# Patient Record
Sex: Female | Born: 1985 | Hispanic: Yes | Marital: Single | Smoking: Never smoker
Health system: Southern US, Community
[De-identification: ages and names within clinical notes are randomized; demographics above are authoritative.]

---

## 2004-08-03 ENCOUNTER — Inpatient Hospital Stay: Payer: Self-pay | Admitting: Obstetrics and Gynecology

## 2007-06-01 ENCOUNTER — Ambulatory Visit: Payer: Self-pay | Admitting: Family Medicine

## 2011-06-19 ENCOUNTER — Ambulatory Visit: Payer: Self-pay | Admitting: Family Medicine

## 2011-09-12 ENCOUNTER — Ambulatory Visit: Payer: Self-pay | Admitting: Family Medicine

## 2011-10-15 ENCOUNTER — Ambulatory Visit: Payer: Self-pay | Admitting: Family Medicine

## 2012-10-09 IMAGING — US US OB FOLLOW-UP
1 series · 13 of 28 positions shown · non-contrast
Comparison: none

REASON FOR EXAM: Low Lying Placenta
COMMENTS:

[Series 1: us ob follow-up · 0.31mm/px · 13 of 50 slices shown]
[im 2/50]
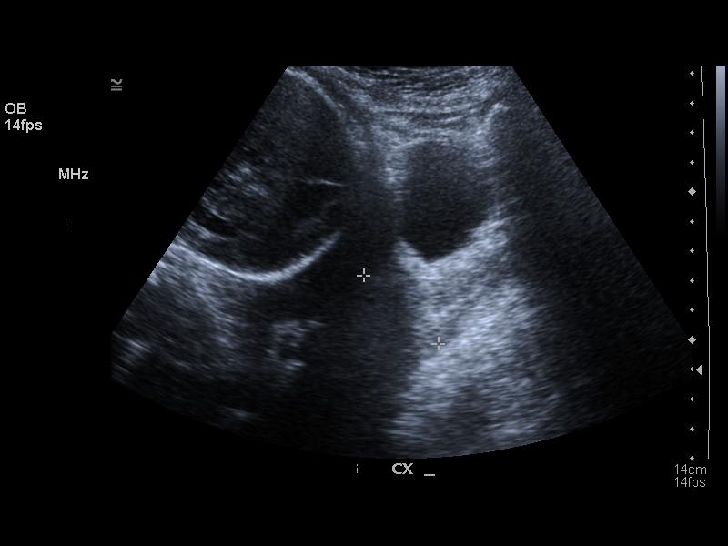
[im 6/50]
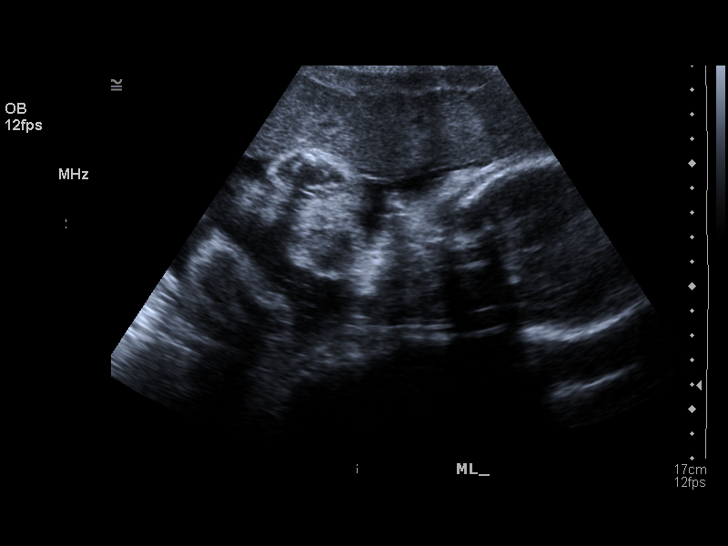
[im 10/50]
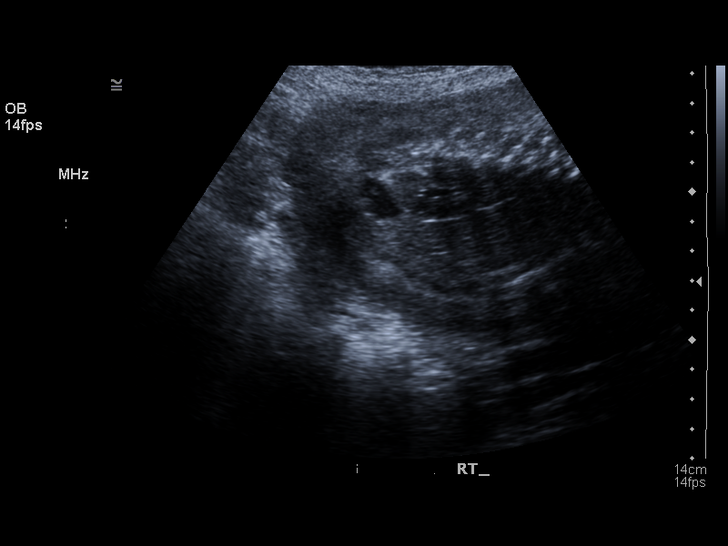
[im 13/50]
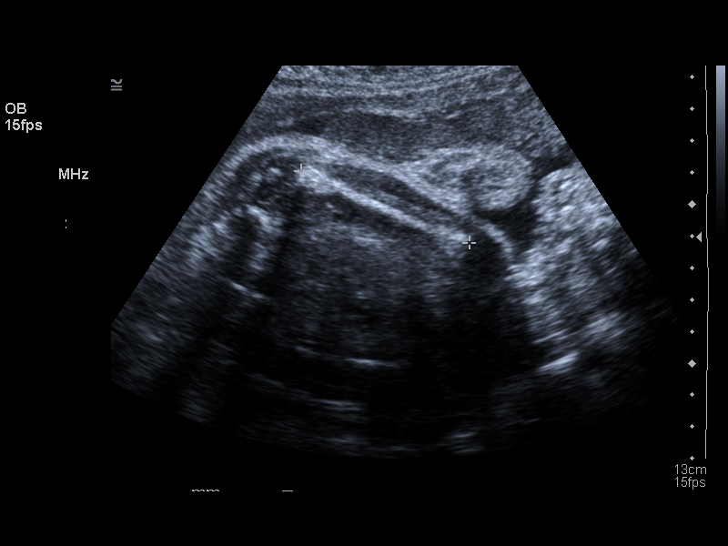
[im 17/50]
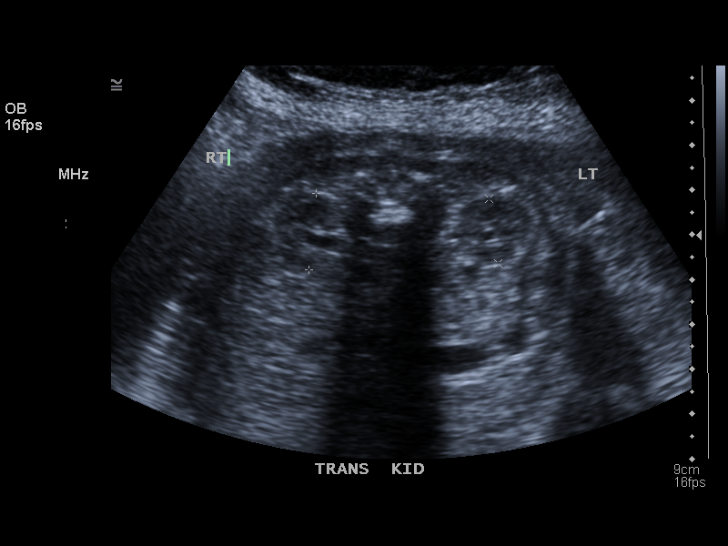
[im 20/50]
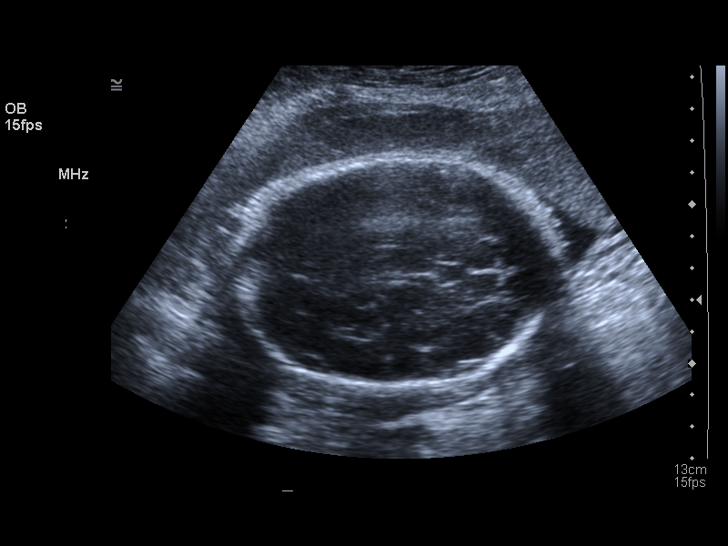
[im 26/50]
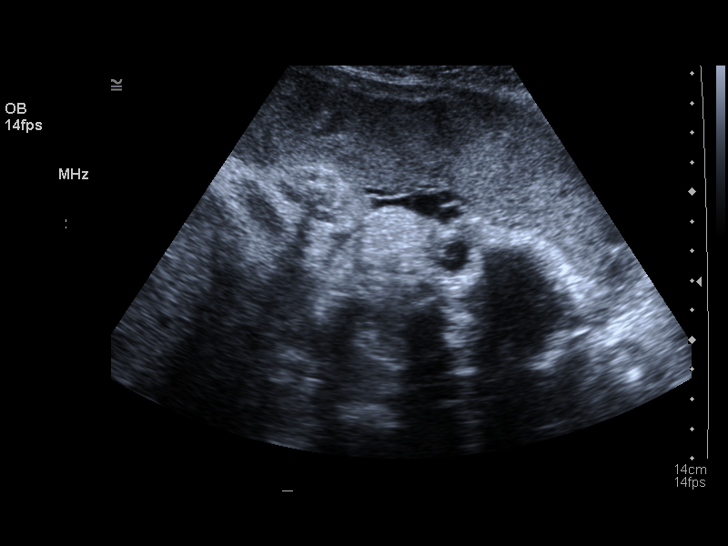
[im 30/50]
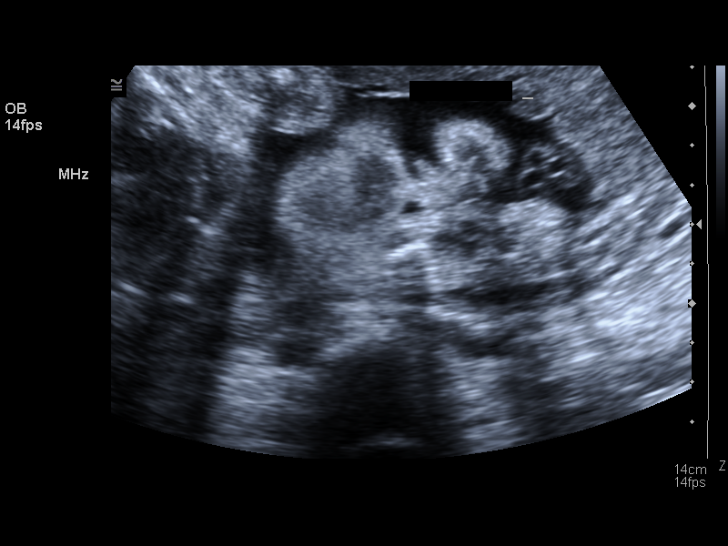
[im 33/50]
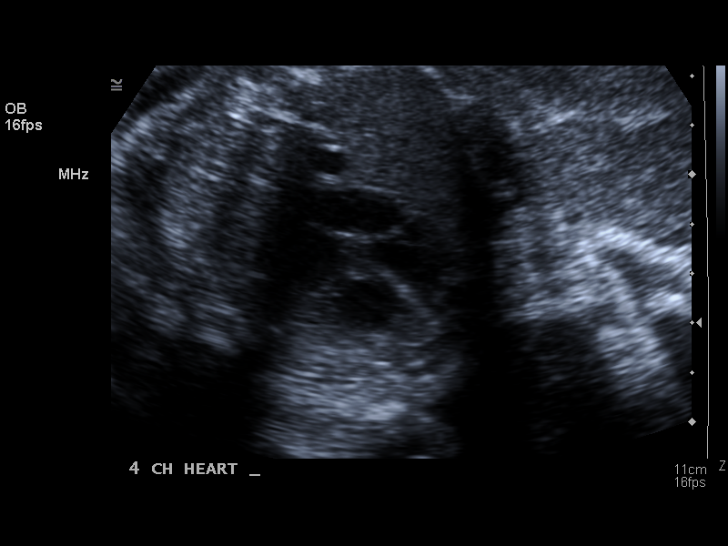
[im 37/50]
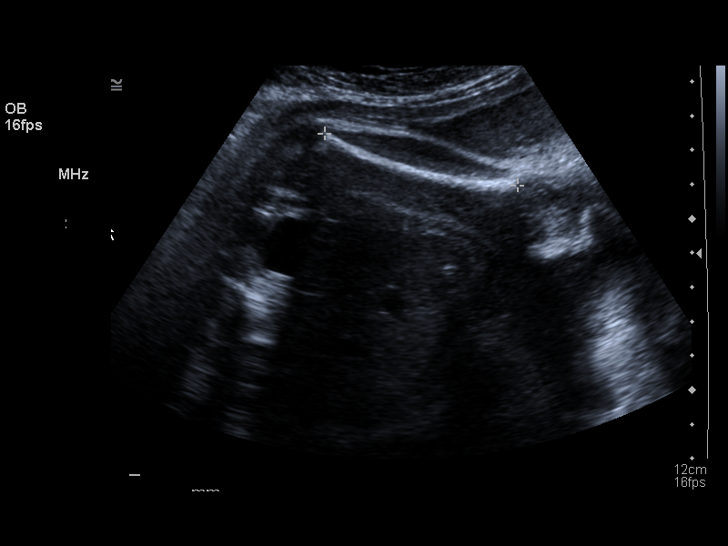
[im 40/50]
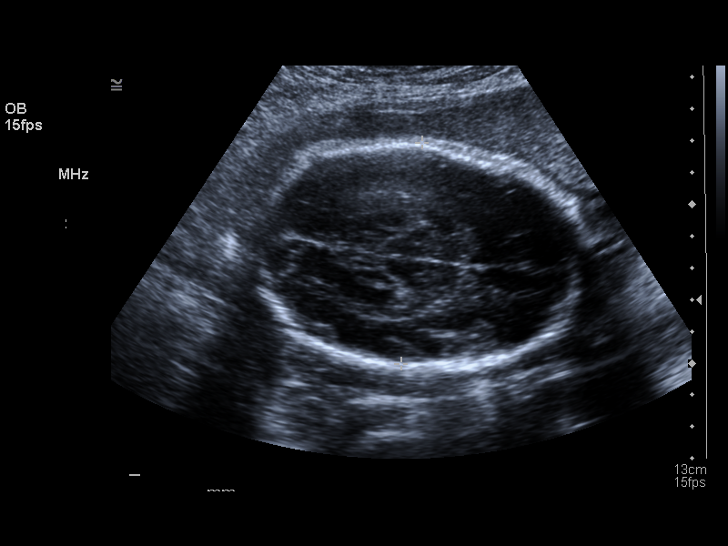
[im 44/50]
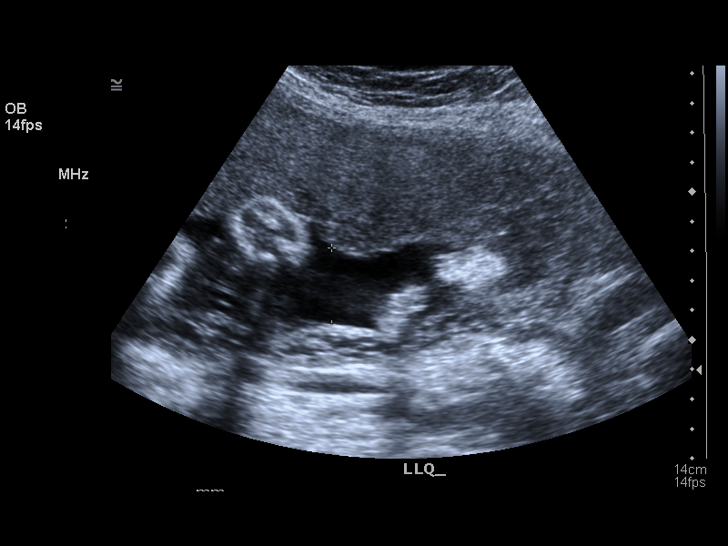
[im 48/50]
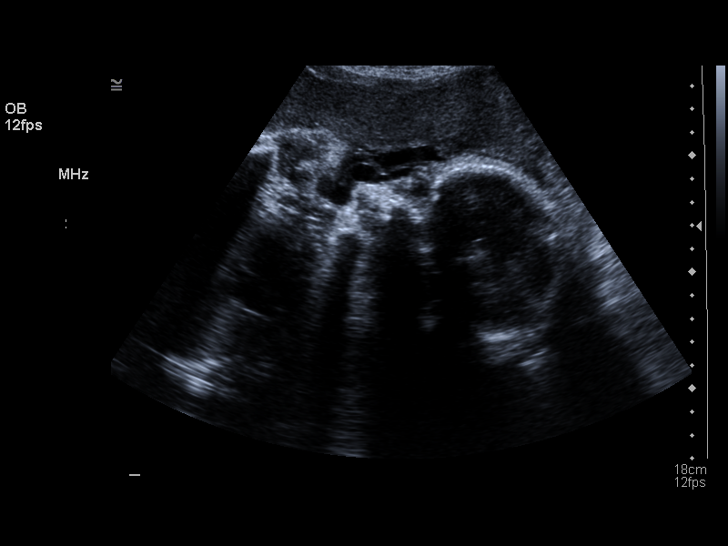

[13 of 28 positions shown; findings below may reference images not displayed]

PROCEDURE:     US  - US OB FOLLOW UP  - September 12, 2011 [DATE]

RESULT:     Followup OB examination shows a living intrauterine gestation.
Presentation currently is cephalic. Fetal heart rate was monitored at 141
beats per minute. The placenta is anterior. The inferior tip of the placenta
is approximately 9.0 cm above the cervix. The placenta no longer is low
lying. Amnionic fluid volume appears scant. AFI measures 9.8 cm which is
near the 5th percentile. The fetal heart, stomach, and urinary bladder are
visualized. No fetal abnormalities are noted. Fetal measurements are as
follows:

BPD     70.4 mm      Corresponding to 28 weeks 2 days.
HC          285.3 mm      Corresponding to 31 weeks 2 days.
AC          274.3 mm      Corresponding to 31 weeks 4 days.
FL          58 mm      Corresponding to 30 weeks 2 days.

AFI is 9.8 cm. EFW is 9041 grams + / - 244 grams. Ultrasound  age based on
today's measurements is approximately 30 weeks 3 days. Ultrasound EDD based
on today's measurements is 11/18/2011. There has been appropriate interval
growth since the prior exam of June 19, 2011.
IMPRESSION: 1. There is a single living intrauterine gestation.
2. The inferior tip of the placenta is approximately 9.0 cm from the cervix.
The placenta is no longer low lying.
3. AFI measures approximately 9.0 cm which is near the 5th percentile.

## 2012-11-11 IMAGING — US US OB FOLLOW-UP
1 series · 17 of 28 positions shown · non-contrast
Comparison: none

REASON FOR EXAM: re eval AFI
COMMENTS:

[Series 1: us ob follow-up · 17 of 48 slices shown]
[im 1/48]
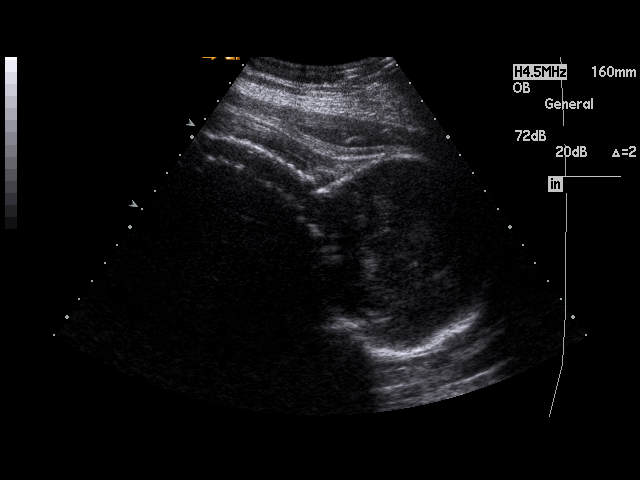
[im 4/48]
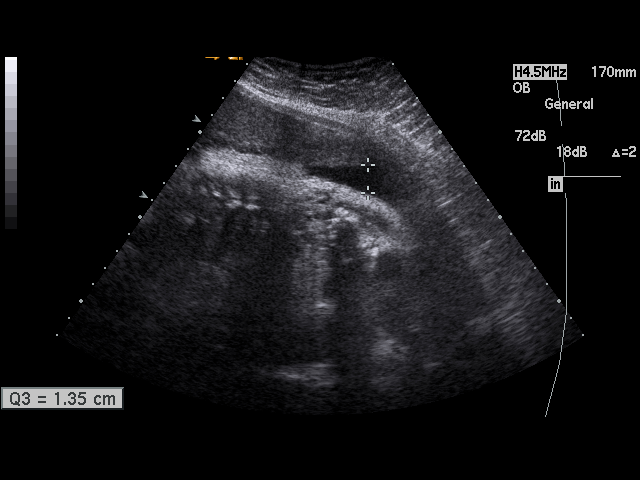
[im 7/48]
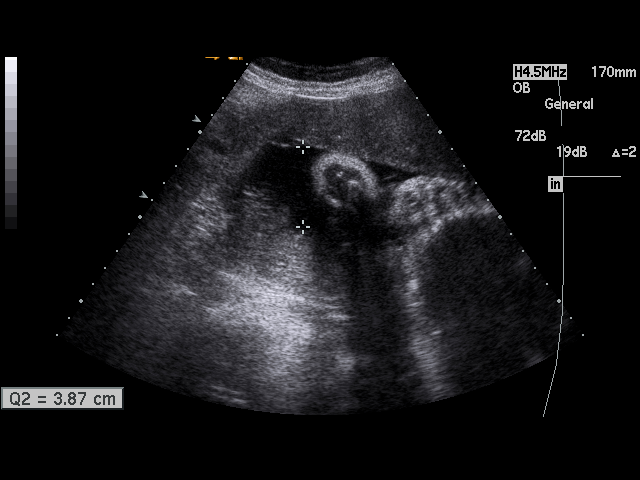
[im 9/48]
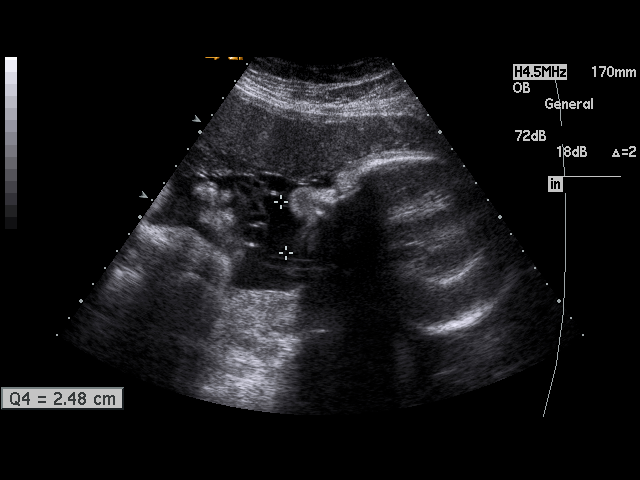
[im 13/48]
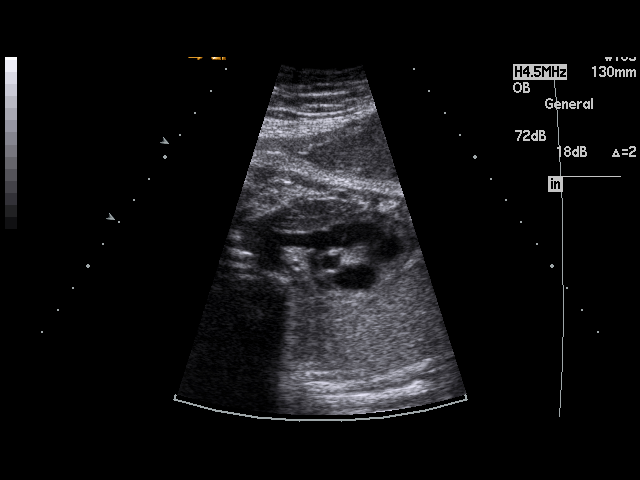
[im 16/48]
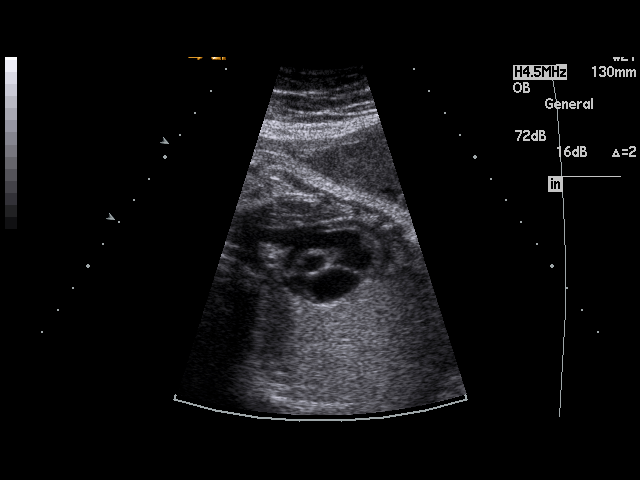
[im 18/48]
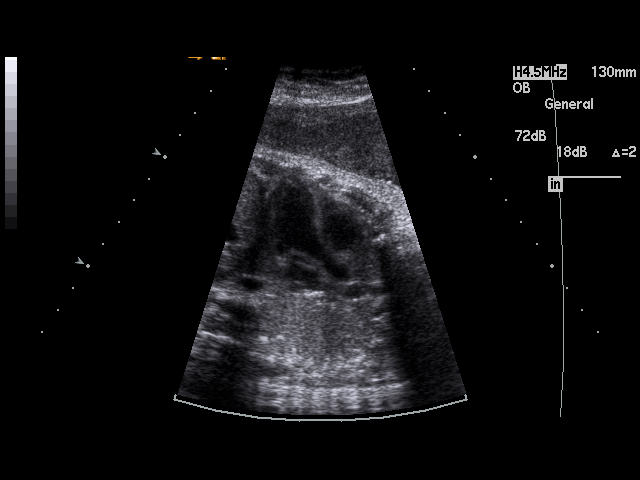
[im 21/48]
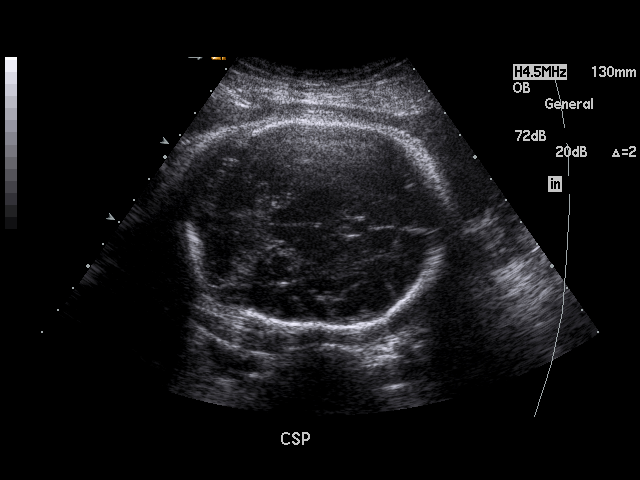
[im 25/48]
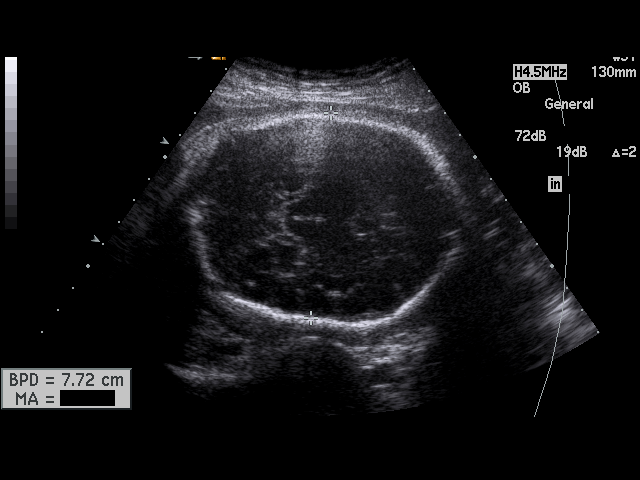
[im 27/48]
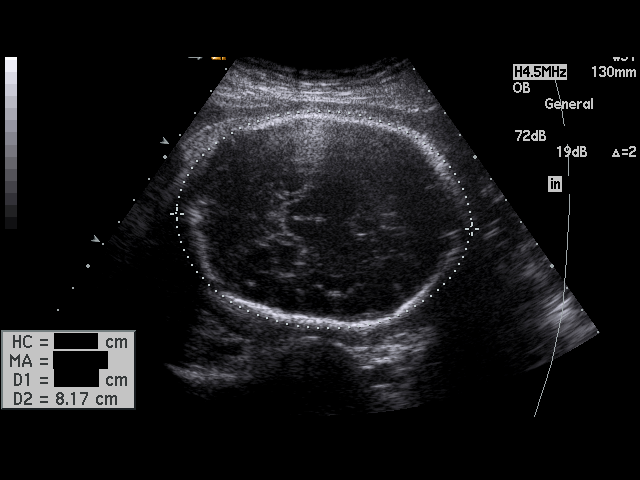
[im 30/48]
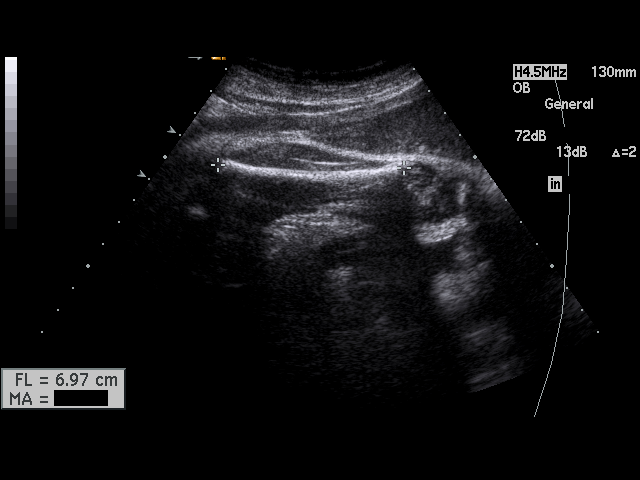
[im 32/48]
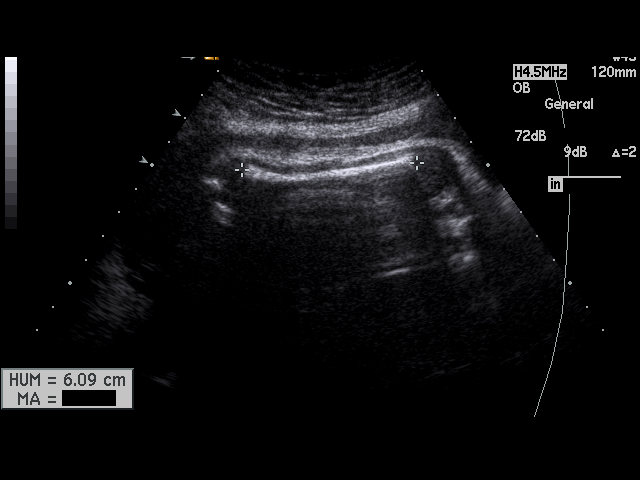
[im 35/48]
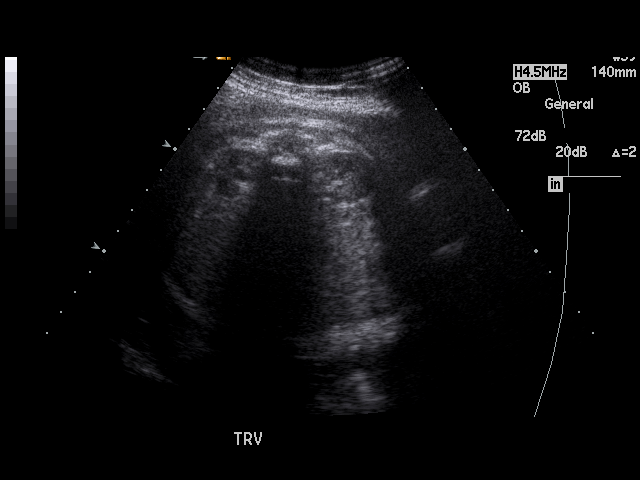
[im 39/48]
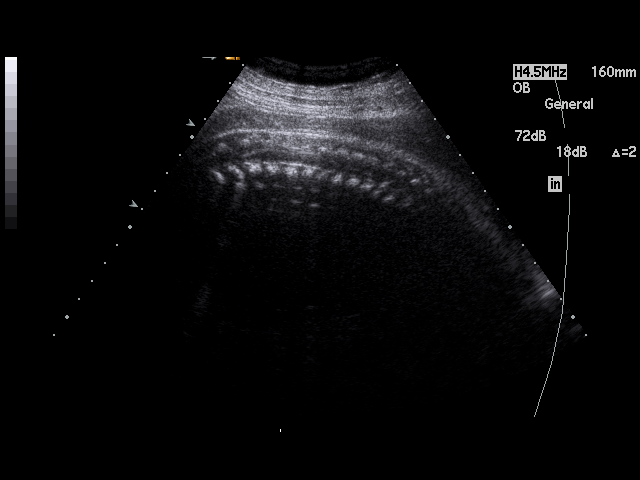
[im 41/48]
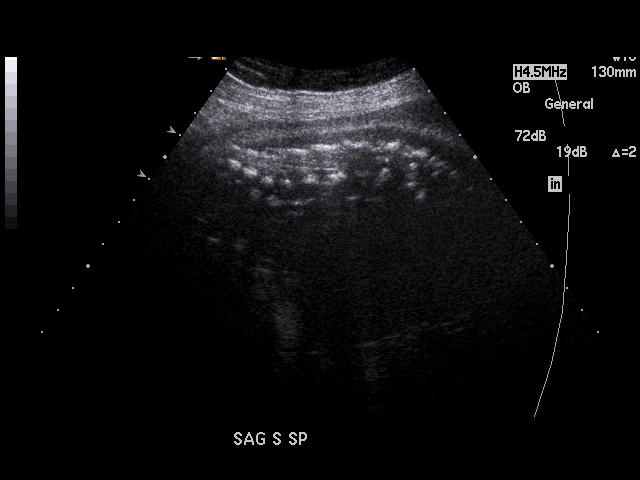
[im 44/48]
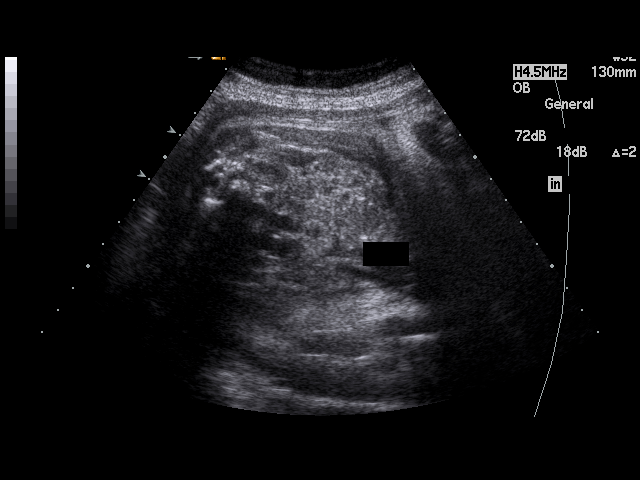
[im 48/48]
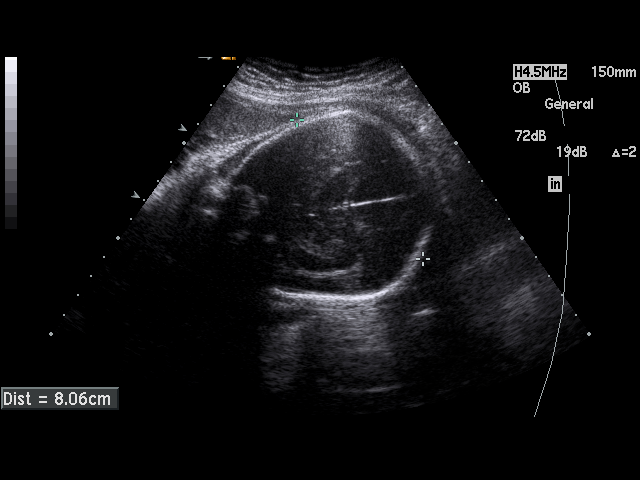

[17 of 28 positions shown; findings below may reference images not displayed]

PROCEDURE:     US  - US OB FOLLOW UP  - October 15, 2011 [DATE]

RESULT:     The patient underwent repeat examination to assess the amniotic
fluid index. The patient had previously undergone ultrasound on 12 September, 2011 at which time an IVP was demonstrated with an amniotic fluid index of 9
cm at approximately the 5th percentile.

On today's study a viable IUP with cephalic presentation and anterior and
partially fundal placenta is seen. There is no evidence of placenta previa
with the lower uterine segment at least 8 cm from the internal cervical os.
The amniotic fluid index measures 8.57 cm which corresponds to
approximately the 5th percentile.

Measured parameters:
BPD 7.7 cm corresponding to an EGA of 31 weeks 1day
HC 30.21 cm corresponding to an EGA of 33 weeks 4 days
AC 31.69 cm corresponding to an EGA of 35 weeks 4 days
FL 6.94 cm corresponding to an EGA of 35 weeks 4 days
Estimated fetal weight 8687 grams + / - 419 grams.

A fetal cardiac rate of 133 beats per minute was demonstrated. A 4 chambered
heart was demonstrated. The kidneys and urinary bladder were demonstrated.
The stomach was not demonstrated.
IMPRESSION: There is a viable IUP with cephalic presentation and
persistent oligohydramnios with the amniotic fluid index measuring
approximately the 5th percentile. The estimated gestational age is 34 weeks
2 days plus or minus approximately 20 days. The estimated date of
confinement is 24 November, 2011.

## 2015-09-25 ENCOUNTER — Encounter: Payer: Self-pay | Admitting: Emergency Medicine

## 2015-09-25 DIAGNOSIS — Z3A01 Less than 8 weeks gestation of pregnancy: Secondary | ICD-10-CM | POA: Insufficient documentation

## 2015-09-25 DIAGNOSIS — O209 Hemorrhage in early pregnancy, unspecified: Secondary | ICD-10-CM | POA: Insufficient documentation

## 2015-09-25 LAB — CBC WITH DIFFERENTIAL/PLATELET
BASOS ABS: 0.1 10*3/uL (ref 0–0.1)
Basophils Relative: 1 %
Eosinophils Absolute: 0.1 10*3/uL (ref 0–0.7)
HCT: 26.8 % — ABNORMAL LOW (ref 35.0–47.0)
HEMOGLOBIN: 7.7 g/dL — AB (ref 12.0–16.0)
LYMPHS ABS: 3 10*3/uL (ref 1.0–3.6)
MCH: 15.1 pg — AB (ref 26.0–34.0)
MCHC: 28.8 g/dL — ABNORMAL LOW (ref 32.0–36.0)
MCV: 52.6 fL — AB (ref 80.0–100.0)
Monocytes Absolute: 0.5 10*3/uL (ref 0.2–0.9)
Monocytes Relative: 6 %
NEUTROS ABS: 4 10*3/uL (ref 1.4–6.5)
PLATELETS: 416 10*3/uL (ref 150–440)
RBC: 5.1 MIL/uL (ref 3.80–5.20)
RDW: 21.8 % — ABNORMAL HIGH (ref 11.5–14.5)
WBC: 7.7 10*3/uL (ref 3.6–11.0)

## 2015-09-25 LAB — URINALYSIS COMPLETE WITH MICROSCOPIC (ARMC ONLY)
Bilirubin Urine: NEGATIVE
GLUCOSE, UA: NEGATIVE mg/dL
Ketones, ur: NEGATIVE mg/dL
NITRITE: NEGATIVE
PROTEIN: NEGATIVE mg/dL
Specific Gravity, Urine: 1.023 (ref 1.005–1.030)
pH: 8 (ref 5.0–8.0)

## 2015-09-25 LAB — POCT PREGNANCY, URINE: Preg Test, Ur: POSITIVE — AB

## 2015-09-25 LAB — ABO/RH: ABO/RH(D): O POS

## 2015-09-25 LAB — HCG, QUANTITATIVE, PREGNANCY: HCG, BETA CHAIN, QUANT, S: 201 m[IU]/mL — AB (ref <5)

## 2015-09-25 NOTE — ED Notes (Signed)
Pt arrived to ED with c/o bloody discharge with  Wiping. Pt reports [redacted] weeks pregnant. [facility]. Pt c/i abdominal cramping and lower back pain. G-5,P3,A-1

## 2015-09-26 ENCOUNTER — Emergency Department
Admission: EM | Admit: 2015-09-26 | Discharge: 2015-09-26 | Discharge status: Against Medical Advice | Payer: Self-pay | Attending: Emergency Medicine | Admitting: Emergency Medicine

## 2015-09-26 ENCOUNTER — Telehealth: Payer: Self-pay | Admitting: Emergency Medicine

## 2015-09-26 NOTE — ED Notes (Signed)
Pt called to ask about lab tests as she left without being seen.  She has called [facility], but they have had a ?leak in the office and are closed today.  I told her the lab results--hcg done and rh positive.  I told her that she needs to call office back to see if they will be open tomorrow and to tell them about the labwork we did.  She is currently not having any bleeding or discharge or pain.  I told her that she has low hgb and hct, but she says she has a history of anemia.  She has no previous values in our system, so I advised her to ask office if her current values are changed from previous.  She agrees and will return here for exam if she gets worse.

## 2021-11-11 ENCOUNTER — Ambulatory Visit: Payer: Self-pay | Admitting: Dermatology

## 2021-12-11 ENCOUNTER — Other Ambulatory Visit: Payer: Self-pay

## 2021-12-11 ENCOUNTER — Ambulatory Visit (INDEPENDENT_AMBULATORY_CARE_PROVIDER_SITE_OTHER): Payer: Self-pay | Admitting: Dermatology

## 2021-12-11 DIAGNOSIS — Q825 Congenital non-neoplastic nevus: Secondary | ICD-10-CM

## 2021-12-11 DIAGNOSIS — B360 Pityriasis versicolor: Secondary | ICD-10-CM

## 2021-12-11 DIAGNOSIS — L853 Xerosis cutis: Secondary | ICD-10-CM

## 2021-12-11 MED ORDER — KETOCONAZOLE 2 % EX CREA: 1.0000 "application " | TOPICAL_CREAM | Freq: Every day | CUTANEOUS | 1 refills | Status: AC

## 2021-12-11 NOTE — Progress Notes (Signed)
? ?  New Patient Visit ? ?Subjective  ?Christy King is a 36 y.o. female who presents for the following: New Patient (Initial Visit) (Patient here today concern a dark spot at chest/abdomen she noticed a year ago. Patient reports that it doesn't bother her but she would like to make sure it was nothing serious. ).  The patient reports no history of injury or rash in the area. ?The patient has spots, moles and lesions to be evaluated, some may be new or changing and the patient has concerns that these could be cancer. ? ?The following portions of the chart were reviewed this encounter and updated as appropriate:  ? Tobacco  Allergies  Meds  Problems  Med Hx  Surg Hx  Fam Hx   ?  ?Review of Systems:  No other skin or systemic complaints except as noted in HPI or Assessment and Plan. ? ?Objective  ?Well appearing patient in no apparent distress; mood and affect are within normal limits. ? ?A focused examination was performed including right scapula back, chest. Relevant physical exam findings are noted in the Assessment and Plan. ? ?xyphoid area ?1.5 cm hyperpigmented macule  ? ? ? ? ? ? ?right scapula ?Light brown macule cafe au lait  ? ? ? ? ? ? ? 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  It is generally asymptomatic. The rash is due to overgrowth of a common type of yeast present on everyone's skin and it is not contagious.  It tends to flare more in the summer due to increased sweating on trunk.  After rash is treated, the scaliness will resolve, but the discoloration will take longer to return to normal pigmentation. The periodic use of an OTC medicated soap/shampoo with zinc or selenium sulfide can be helpful to prevent yeast overgrowth and recurrence. ?  ?Not curable but treatable.  ? ?Information given to patient at visit today. ? ?Start ketoconazole 2  % cream - apply topically to affected areas at chest for 6 weeks.  ? ?ketoconazole (NIZORAL) 2 % cream - xyphoid area ?Apply 1 application. topically daily. To affected dark spots at chest. Use for 6 weeks. ? 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  Observation.  Call clinic for new or changing lesions.  Recommend daily use of broad spectrum spf 30+ sunscreen to sun-exposed areas.  ? ?Xerosis ?- diffuse xerotic patches ?- recommend gentle, hydrating skin care ?- gentle skin care handout given ? ?Return for 3 month follow up on tinea vessicolor . ? ?I, Asher Muir, CMA, am acting as scribe for Armida Sans, MD. ?Documentation: I have reviewed the above documentation for accuracy and completeness, and I agree with the above. ? ?Armida Sans, MD ? ? ? ?

## 2021-12-11 NOTE — Patient Instructions (Addendum)
Gentle Skin Care Guide ° °1. Bathe no more than once a day. ° °2. Avoid bathing in hot water ° °3. Use a mild soap like Dove, Vanicream, Cetaphil, CeraVe. Can use Lever 2000 or Cetaphil antibacterial soap ° °4. Use soap only where you need it. On most days, use it under your arms, between your legs, and on your feet. Let the water rinse other areas unless visibly dirty. ° °5. When you get out of the bath/shower, use a towel to gently blot your skin dry, don't rub it. ° °6. While your skin is still a little damp, apply a moisturizing cream such as Vanicream, CeraVe, Cetaphil, Eucerin, Sarna lotion or plain Vaseline Jelly. For hands apply Neutrogena Norwegian Hand Cream or Excipial Hand Cream. ° °7. Reapply moisturizer any time you start to itch or feel dry. ° °8. Sometimes using free and clear laundry detergents can be helpful. Fabric softener sheets should be avoided. Downy Free & Gentle liquid, or any liquid fabric softener that is free of dyes and perfumes, it acceptable to use ° °9. If your doctor has given you prescription creams you may apply moisturizers over them  ° ° ° ° ° ° ° °If You Need Anything After Your Visit ° °If you have any questions or concerns for your doctor, please call our main line at [phone] and press option 4 to reach your doctor's medical assistant. If no one answers, please leave a voicemail as directed and we will return your call as soon as possible. Messages left after 4 pm will be answered the following business day.  ° °You may also send us a message via MyChart. We typically respond to MyChart messages within 1-2 business days. ° °For prescription refills, please ask your pharmacy to contact our office. Our fax number is [phone]. ° °If you have an urgent issue when the clinic is closed that cannot wait until the next business day, you can page your doctor at the number below.   ° °Please note that while we do our best to be available for urgent issues outside of office  hours, we are not available 24/7.  ° °If you have an urgent issue and are unable to reach us, you may choose to seek medical care at your doctor's office, retail clinic, urgent care center, or emergency room. ° °If you have a medical emergency, please immediately call 911 or go to the emergency department. ° °Pager Numbers ° °- Dr. Kowalski: [phone] ° °- Dr. Moye: [phone] ° °- Dr. Stewart: [phone] ° °In the event of inclement weather, please call our main line at [phone] for an update on the status of any delays or closures. ° °Dermatology Medication Tips: °Please keep the boxes that topical medications come in in order to help keep track of the instructions about where and how to use these. Pharmacies typically print the medication instructions only on the boxes and not directly on the medication tubes.  ° °If your medication is too expensive, please contact our office at [phone] option 4 or send us a message through MyChart.  ° °We are unable to tell what your co-pay for medications will be in advance as this is different depending on your insurance coverage. However, we may be able to find a substitute medication at lower cost or fill out paperwork to get insurance to cover a needed medication.  ° °If a prior authorization is required to get your medication covered by your insurance company, please allow us 1-2   business days to complete this process. ° °Drug prices often vary depending on where the prescription is filled and some pharmacies may offer cheaper prices. ° °The website www.goodrx.com contains coupons for medications through different pharmacies. The prices here do not account for what the cost may be with help from insurance (it may be cheaper with your insurance), but the website can give you the price if you did not use any insurance.  °- You can print the associated coupon and take it with your prescription to the pharmacy.  °- You may also stop by our office during regular  business hours and pick up a GoodRx coupon card.  °- If you need your prescription sent electronically to a different pharmacy, notify our office through [facility] MyChart or by phone at [phone] option 4. ° ° ° ° °Si Usted Necesita Algo Después de Su Visita ° °También puede enviarnos un mensaje a través de MyChart. Por lo general respondemos a los mensajes de MyChart en el transcurso de 1 a 2 días hábiles. ° °Para renovar recetas, por favor pida a su farmacia que se ponga en contacto con nuestra oficina. Nuestro número de fax es el [phone]. ° °Si tiene un asunto urgente cuando la clínica esté cerrada y que no puede esperar hasta el siguiente día hábil, puede llamar/localizar a su doctor(a) al número que aparece a continuación.  ° °Por favor, tenga en cuenta que aunque hacemos todo lo posible para estar disponibles para asuntos urgentes fuera del horario de oficina, no estamos disponibles las 24 horas del día, los 7 días de la semana.  ° °Si tiene un problema urgente y no puede comunicarse con nosotros, puede optar por buscar atención médica  en el consultorio de su doctor(a), en una clínica privada, en un centro de atención urgente o en una sala de emergencias. ° °Si tiene una emergencia médica, por favor llame inmediatamente al 911 o vaya a la sala de emergencias. ° °Números de bíper ° °- Dr. Kowalski: [phone] ° °- Dra. Moye: [phone] ° °- Dra. Stewart: [phone] ° °En caso de inclemencias del tiempo, por favor llame a nuestra línea principal al [phone] para una actualización sobre el estado de cualquier retraso o cierre. ° °Consejos para la medicación en dermatología: °Por favor, guarde las cajas en las que vienen los medicamentos de uso tópico para ayudarle a seguir las instrucciones sobre dónde y cómo usarlos. Las farmacias generalmente imprimen las instrucciones del medicamento sólo en las cajas y no directamente en los tubos del medicamento.  ° °Si su medicamento es muy caro, por  favor, póngase en contacto con nuestra oficina llamando al [phone] y presione la opción 4 o envíenos un mensaje a través de MyChart.  ° °No podemos decirle cuál será su copago por los medicamentos por adelantado ya que esto es diferente dependiendo de la cobertura de su seguro. Sin embargo, es posible que podamos encontrar un medicamento sustituto a menor costo o llenar un formulario para que el seguro cubra el medicamento que se considera necesario.  ° °Si se requiere una autorización previa para que su compañía de seguros cubra su medicamento, por favor permítanos de 1 a 2 días hábiles para completar este proceso. ° °Los precios de los medicamentos varían con frecuencia dependiendo del lugar de dónde se surte la receta y alguna farmacias pueden ofrecer precios más baratos. ° °El sitio web www.goodrx.com tiene cupones para medicamentos de diferentes farmacias. Los precios aquí no tienen en cuenta lo que podría costar con   la ayuda del seguro (puede ser más barato con su seguro), pero el sitio web puede darle el precio si no utilizó ningún seguro.  °- Puede imprimir el cupón correspondiente y llevarlo con su receta a la farmacia.  °- También puede pasar por nuestra oficina durante el horario de atención regular y recoger una tarjeta de cupones de GoodRx.  °- Si necesita que su receta se envíe electrónicamente a una farmacia diferente, informe a nuestra oficina a través de [facility] o por teléfono llamando al [phone] y presione la opción 4.  °

## 2021-12-15 ENCOUNTER — Encounter: Payer: Self-pay | Admitting: Dermatology

## 2022-01-08 ENCOUNTER — Ambulatory Visit: Payer: Self-pay | Admitting: Dermatology

## 2022-03-13 ENCOUNTER — Ambulatory Visit: Payer: Self-pay | Admitting: Dermatology
